# Patient Record
Sex: Female | Born: 1980 | Hispanic: Yes | Marital: Single | Smoking: Never smoker
Health system: Southern US, Community
[De-identification: ages and names within clinical notes are randomized; demographics above are authoritative.]

---

## 2012-09-02 ENCOUNTER — Encounter: Payer: Self-pay | Admitting: Family Medicine

## 2012-09-02 ENCOUNTER — Ambulatory Visit (INDEPENDENT_AMBULATORY_CARE_PROVIDER_SITE_OTHER): Payer: Medicaid Other | Admitting: Family Medicine

## 2012-09-02 VITALS — BP 91/55 | HR 59 | Temp 97.5°F | Wt 166.2 lb

## 2012-09-02 DIAGNOSIS — E669 Obesity, unspecified: Secondary | ICD-10-CM

## 2012-09-02 DIAGNOSIS — D696 Thrombocytopenia, unspecified: Secondary | ICD-10-CM

## 2012-09-02 DIAGNOSIS — J309 Allergic rhinitis, unspecified: Secondary | ICD-10-CM

## 2012-09-02 DIAGNOSIS — R635 Abnormal weight gain: Secondary | ICD-10-CM

## 2012-09-02 LAB — CBC
HCT: 38 % (ref 36.0–46.0)
Platelets: 139 10*3/uL — ABNORMAL LOW (ref 150–400)
RDW: 13 % (ref 11.5–15.5)
WBC: 5.1 10*3/uL (ref 4.0–10.5)

## 2012-09-02 MED ORDER — CETIRIZINE HCL 10 MG PO CAPS: 10.0000 mg | ORAL_CAPSULE | Freq: Every day | ORAL | Status: DC

## 2012-09-02 MED ORDER — FLUTICASONE PROPIONATE 50 MCG/ACT NA SUSP: 2.0000 | Freq: Every day | NASAL | Status: DC

## 2012-09-03 NOTE — Progress Notes (Signed)
Patient ID: Ashley Skinner, female   DOB: 19-Jun-1980, 32 y.o.   MRN: [id number] New Patient Visit:  HPI:  Pt presents today for new pt visit.  Pt denies any acute issues today apart from allergic rhinitis and obesity.  Recently relocated from [place] to [place] area.  Has chronic sneezing and rhinorrhea.  Previously on flonase and zyrtec and would like refill.  No issues with this medication in the past.    Would also like a nutrition referral to help with weight loss.  Trying to exercise on a more regular basis.   Also with hx/o intermittent thrombocytopenia in the past of unknown origin.  Occurred several years ago. No recent flares. No reports of petechiae or easy bruising.  Not on ASA.   There are no active problems to display for this patient.  Past Medical History: No past medical history on file.  Past Surgical History: No past surgical history on file.  Social History: History   Social History  . Marital Status: Single    Spouse Name: N/A    Number of Children: N/A  . Years of Education: N/A   Social History Main Topics  . Smoking status: Smoker, Current Status Unknown    Types: Cigarettes  . Smokeless tobacco: Not on file     Comment: smokes on occ /socially  . Alcohol Use: Not on file  . Drug Use: Not on file  . Sexually Active: Not on file   Other Topics Concern  . Not on file   Social History Narrative  . No narrative on file    Family History: No family history on file.  Allergies: Allergies not on file  Current Outpatient Prescriptions  Medication Sig Dispense Refill  . Cetirizine HCl 10 MG CAPS Take 1 capsule (10 mg total) by mouth daily.  30 capsule  3  . fluticasone (FLONASE) 50 MCG/ACT nasal spray Place 2 sprays into the nose daily.  16 g  6   No current facility-administered medications for this visit.   Review Of Systems: 12 point ROS negative except as noted above in HPI  Physical Exam: Filed Vitals:   09/02/12 1213  BP:  91/55  Pulse: 59  Temp: 97.5 F (36.4 C)   General: alert and cooperative HEENT: PERRLA and extra ocular movement intact Heart: S1, S2 normal, no murmur, rub or gallop, regular rate and rhythm Lungs: clear to auscultation Abdomen: abdomen is soft without significant tenderness, masses, organomegaly or guarding Extremities: extremities normal, atraumatic, no cyanosis or edema Skin:no rashes, no ecchymoses Neurology: normal without focal findings  Labs and Imaging:  Lab Results  Component Value Date   WBC 5.1 09/02/2012   HGB 12.8 09/02/2012   HCT 38.0 09/02/2012   MCV 86.2 09/02/2012   PLT 139* 09/02/2012     Assessment and Plan:  Allergic rhinitis - Plan: fluticasone (FLONASE) 50 MCG/ACT nasal spray, Cetirizine HCl 10 MG CAPS  Obesity - Plan: Amb ref to Medical Nutrition Therapy-MNT  Thrombocytopenia, unspecified - Plan: CBC  Otherwise healthy.  Follow up as needed.  Will trend CBCs over next 6-12 months. Consider H/O referral if plts drop < 110.

## 2012-09-05 ENCOUNTER — Telehealth (HOSPITAL_COMMUNITY): Payer: Self-pay | Admitting: Dietician

## 2012-09-05 NOTE — Telephone Encounter (Signed)
Called and left message on voicemail at [phone].

## 2012-09-05 NOTE — Telephone Encounter (Signed)
Received referral via fax from [facility] for dx: obesity.

## 2012-09-06 ENCOUNTER — Telehealth: Payer: Self-pay | Admitting: Family Medicine

## 2012-09-06 NOTE — Telephone Encounter (Signed)
Spoke with pt and she has appt with nuturtionist at [facility].

## 2012-09-06 NOTE — Telephone Encounter (Signed)
Pt left voicemail at 1655 on 09/05/12. Returned call at [phone] tofay. She reports [facility] also called her as well. She would prefer to go to [place], as this is closer to her home. Appointment scheduled for 09/27/12 at 1400. Pt reports she is unsure of her class schedule at this time, but reports she will call back if this time does not work for her.

## 2012-09-08 ENCOUNTER — Encounter (INDEPENDENT_AMBULATORY_CARE_PROVIDER_SITE_OTHER): Payer: Medicaid Other | Admitting: *Deleted

## 2012-09-08 DIAGNOSIS — D696 Thrombocytopenia, unspecified: Secondary | ICD-10-CM

## 2012-09-08 LAB — COMPREHENSIVE METABOLIC PANEL
ALT: 15 U/L (ref 0–35)
AST: 13 U/L (ref 0–37)
Albumin: 4.5 g/dL (ref 3.5–5.2)
Calcium: 9.2 mg/dL (ref 8.4–10.5)
Chloride: 106 mEq/L (ref 96–112)
Potassium: 4.3 mEq/L (ref 3.5–5.3)
Total Protein: 6.8 g/dL (ref 6.0–8.3)

## 2012-09-08 NOTE — Progress Notes (Signed)
Patient ID: Ashley Skinner, female   DOB: 01/18/1981, 32 y.o.   MRN: [id number] Pt here for nurse visit for lab draw.  Thrombocytopenia, unspecified - Plan: Comprehensive metabolic panel, HIV antibody, CBC Add on DIC panel

## 2012-09-27 ENCOUNTER — Encounter (HOSPITAL_COMMUNITY): Payer: Self-pay | Admitting: Dietician

## 2012-09-27 NOTE — Progress Notes (Signed)
Outpatient Initial Nutrition Assessment  Date:09/27/2012   Appt Start Time: 1403  Referring Physician: [facility] Reason for Visit: obesity  Nutrition Assessment:  Height: 5\' 4"  (162.6 cm)   Weight: 168 lb (76.204 kg)   IBW: 120# %IBW: 140% UBW: 145# %UBW: 116% Body mass index is 28.82 kg/(m^2). Meets criteria for overweight.  Goal Weight: 149# (10% of current weight) Weight hx: Pt reports highest weight was 185#, after giving birth to her 26 year old son. Lowest adult weight was 130-135# around age 61. She reports UBW: 145, which she maintained up to 2 years ago, after the birth of her daughter.   Estimated nutritional needs: 1500-1600 kcals daily, 61-76 grams protein daily, 1.5-1.6 L fluid daily   PMH: History reviewed. No pertinent past medical history.  Medications:  Current Outpatient Rx  Name  Route  Sig  Dispense  Refill  . Cetirizine HCl 10 MG CAPS   Oral   Take 1 capsule (10 mg total) by mouth daily.   30 capsule   3   . fluticasone (FLONASE) 50 MCG/ACT nasal spray   Nasal   Place 2 sprays into the nose daily.   16 g   6     Labs: CMP     Component Value Date/Time   NA 141 09/08/2012 1645   K 4.3 09/08/2012 1645   CL 106 09/08/2012 1645   CO2 27 09/08/2012 1645   GLUCOSE 82 09/08/2012 1645   BUN 13 09/08/2012 1645   CREATININE 0.61 09/08/2012 1645   CALCIUM 9.2 09/08/2012 1645   PROT 6.8 09/08/2012 1645   ALBUMIN 4.5 09/08/2012 1645   AST 13 09/08/2012 1645   ALT 15 09/08/2012 1645   ALKPHOS 77 09/08/2012 1645   BILITOT 0.8 09/08/2012 1645    Lipid Panel  No results found for this basename: chol, trig, hdl, cholhdl, vldl, ldlcalc     No results found for this basename: HGBA1C   Lab Results  Component Value Date   CREATININE 0.61 09/08/2012     Lifestyle/ social habits: Ms. Carreto lives in [place], [place] with her mother, and 3 children (ages 47, 62, and 2). She recently relocated to [place] from [place]; pt reports she lost her job and moved where she could get family  support. She is currently unemployed. 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 She reports her stress level as an 3/10, citing she has very little stress and very good family support. She reports that she does not exercise, apart from playing with her kids outdoors. She used to exercise daily at the gym 3 years a go, participating in a variety of exercises.   Nutrition hx/habits: Ms. Vellucci reports no changes in her diet recently. She is concerned about her weight and would like to get back to 145#. She reports she was most successful with weight loss about 2 years ago, where she went from 180# to 145# with diet, exercise, as well as phentermine and hydrochlorothiazide. She reports she has made attempts to lose weight with diet and exercise in the past, but reports that "oit was so much easier" with the help of medications. She usually eats 1-2 meals per day. She does not have a set meal schedule ("My schedule is set around my kids"). She steams most meats and uses very little fat and oil. She also reports she has used an Manufacturing engineer diary in the past ([employer]ose [employer]!), but discontinued when her phone broke. She also reported that it was  difficult for her to track her calories with that app, as a directory of her cultural foods were not available. She reports that she would like to lose weight quickly.   Diet recall: Breakfast (9-11 AM): 3/4 cup cornflakes and 2% milk; Lunc9 @-3 PM): refried rice with steamed beef and broccoli, unsweetened tea with sweet and low; Dinner: skip; Snack: apple or orange.  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   Nutrition Intervention: Nutrition rx: 1200 kcal NAS, no added sugar diet; 3 meals per day; whole grains, lean meats and proteins most often; 3-5 servings of fruits and vegetables daily; low calorie beverages only; 30 minutes physical activity  daily  Education/Counseling Provided: Educated pt on principles of weight management. Discussed principles of energy expenditure and how changes in diet and physical activity affect weight status. Discussed nutritional content of commonly eaten foods and suggested healthier alternatives. Educated pt on plate method and a general, healthful diet that includes low fat dairy, lean meats, whole fruits and vegetables, and whole grains most often. Discussed importance of a healthy diet along with regular physical activity (at least 30 minutes 5 times per week) to achieve weight loss goals. Encouraged slow, moderate weight loss (0.5-2# weight loss per week) and adopting healthy lifestyle changes vs. obtaining a certain body type or weight. Encouraged weighing self weekly at a consistent day and time of choice. Showed pt functionality of MyFitnessPal and encouraged using a food diary to better track caloric intake. Provided AND Nutrition Care Manual's "1200 Calore Diet" and "Weight Loss Tips" handouts. Also provided a list of nearby fitness centers per pt request. Used TeachBack to assess understanding.   Understanding, Motivation, Ability to Follow Recommendations: Expect fair compliance.   Monitoring and Evaluation: Goals: 1) 0.5-2# wt loss per week; 2) 3 meals per day; 3) 30 minutes physical activity daily  Recommendations: 1) For weight loss: At least 1200 kcals daily; 2) try to eat around the same time each day (3 meals daily); 3) Be creative with physical activity (play with children); 4) Break up exercise into smaller, more frequent sessions; 5) Try MyFitnessPal online food dairy (has more extensive food directory)  F/U: PRN. Pt did not desire follow-up; reports she understands what she needs to do.   Melody Haver, RD, LDN 09/27/2012  Appt EndTime: 1504

## 2012-10-07 ENCOUNTER — Ambulatory Visit (INDEPENDENT_AMBULATORY_CARE_PROVIDER_SITE_OTHER): Payer: Medicaid Other | Admitting: Family Medicine

## 2012-10-07 ENCOUNTER — Encounter: Payer: Self-pay | Admitting: Family Medicine

## 2012-10-07 VITALS — BP 99/59 | HR 75 | Temp 98.0°F | Ht 64.0 in | Wt 166.0 lb

## 2012-10-07 DIAGNOSIS — R635 Abnormal weight gain: Secondary | ICD-10-CM

## 2012-10-07 DIAGNOSIS — E669 Obesity, unspecified: Secondary | ICD-10-CM

## 2012-10-07 DIAGNOSIS — R634 Abnormal weight loss: Secondary | ICD-10-CM

## 2012-10-07 MED ORDER — PHENTERMINE HCL 37.5 MG PO CAPS: 37.5000 mg | ORAL_CAPSULE | ORAL | Status: DC

## 2012-10-07 NOTE — Progress Notes (Signed)
  Subjective:    Patient ID: Ashley Skinner, female    DOB: 11-12-1980, 32 y.o.   MRN: [id number]  HPI This 32 y.o. female presents for evaluation of weight loss and thrombocytopenia. She has been to see the nutritionist and has started a diet and has exercised And she has not been able to lose weight.  She has been having difficulty With having a strong appetite she states.   Review of Systems No chest pain, SOB, HA, dizziness, vision change, N/V, diarrhea, constipation, dysuria, urinary urgency or frequency, myalgias, arthralgias or rash.     Objective:   Physical Exam Vital signs noted  Well developed well nourished female.  HEENT - Head atraumatic Normocephalic                Eyes - PERRLA, Conjuctiva - clear Sclera- Clear EOMI                Ears - EAC's Wnl TM's Wnl Gross Hearing WNL                Nose - Nares patent                 Throat - oropharanx wnl Respiratory - Lungs CTA bilateral Cardiac - RRR S1 and S2 without murmur        Assessment & Plan:  Obesity, unspecified - Plan: phentermine 37.5 MG capsule po qd #30w/1 rf. Discussed this is not going to be rx'd again and that I do not think this is good for A long term solution for weight loss.  Discussed drinking 2 glasses of H2O before eating And eating fiber bars from [employer] with water to decrease appetite.  I also discussed that She needs to eat slow and consider a referral to weight loss center for weight loss management.  Loss of weight - Tx as above.

## 2012-10-07 NOTE — Patient Instructions (Signed)
Calorie Counting Diet A calorie counting diet requires you to eat the number of calories that are right for you in a day. Calories are the measurement of how much energy you get from the food you eat. Eating the right amount of calories is important for staying at a healthy weight. If you eat too many calories, your body will store them as fat and you may gain weight. If you eat too few calories, you may lose weight. Counting the number of calories you eat during a day will help you know if you are eating the right amount. A Registered Dietitian can determine how many calories you need in a day. The amount of calories needed varies from person to person. If your goal is to lose weight, you will need to eat fewer calories. Losing weight can benefit you if you are overweight or have health problems such as heart disease, high blood pressure, or diabetes. If your goal is to gain weight, you will need to eat more calories. Gaining weight may be necessary if you have a certain health problem that causes your body to need more energy. TIPS Whether you are increasing or decreasing the number of calories you eat during a day, it may be hard to get used to changes in what you eat and drink. The following are tips to help you keep track of the number of calories you eat.  Measure foods at home with measuring cups. This helps you know the amount of food and number of calories you are eating.  Restaurants often serve food in amounts that are larger than 1 serving. While eating out, estimate how many servings of a food you are given. For example, a serving of cooked rice is  cup or about the size of half of a fist. Knowing serving sizes will help you be aware of how much food you are eating at restaurants.  Ask for smaller portion sizes or child-size portions at restaurants.  Plan to eat half of a meal at a restaurant. Take the rest home or share the other half with a friend.  Read the Nutrition Facts panel on  food labels for calorie content and serving size. You can find out how many servings are in a package, the size of a serving, and the number of calories each serving has.  For example, a package might contain 3 cookies. The Nutrition Facts panel on that package says that 1 serving is 1 cookie. Below that, it will say there are 3 servings in the container. The calories section of the Nutrition Facts label says there are 90 calories. This means there are 90 calories in 1 cookie (1 serving). If you eat 1 cookie you have eaten 90 calories. If you eat all 3 cookies, you have eaten 270 calories (3 servings x 90 calories = 270 calories). The list below tells you how big or small some common portion sizes are.  1 oz.........4 stacked dice.  3 oz.........Deck of cards.  1 tsp........Tip of little finger.  1 tbs........Thumb.  2 tbs........Golf ball.   cup.......Half of a fist.  1 cup........A fist. KEEP A FOOD LOG Write down every food item you eat, the amount you eat, and the number of calories in each food you eat during the day. At the end of the day, you can add up the total number of calories you have eaten. It may help to keep a list like the one below. Find out the calorie information by reading the   Nutrition Facts panel on food labels. Breakfast  Bran cereal (1 cup, 110 calories).  Fat-free milk ( cup, 45 calories). Snack  Apple (1 medium, 80 calories). Lunch  Spinach (1 cup, 20 calories).  Tomato ( medium, 20 calories).  Chicken breast strips (3 oz, 165 calories).  Shredded cheddar cheese ( cup, 110 calories).  Light Italian dressing (2 tbs, 60 calories).  Whole-wheat bread (1 slice, 80 calories).  Tub margarine (1 tsp, 35 calories).  Vegetable soup (1 cup, 160 calories). Dinner  Pork chop (3 oz, 190 calories).  Brown rice (1 cup, 215 calories).  Steamed broccoli ( cup, 20 calories).  Strawberries (1  cup, 65 calories).  Whipped cream (1 tbs, 50  calories). Daily Calorie Total: 1425 Document Released: 03/09/2005 Document Revised: 06/01/2011 Document Reviewed: 09/03/2006 ExitCare Patient Information 2014 [employer].  

## 2012-10-19 ENCOUNTER — Encounter: Payer: Self-pay | Admitting: Nurse Practitioner

## 2012-10-19 ENCOUNTER — Ambulatory Visit (INDEPENDENT_AMBULATORY_CARE_PROVIDER_SITE_OTHER): Payer: Medicaid Other | Admitting: Nurse Practitioner

## 2012-10-19 VITALS — BP 105/65 | HR 72 | Temp 97.2°F | Ht 64.0 in | Wt 164.0 lb

## 2012-10-19 DIAGNOSIS — R635 Abnormal weight gain: Secondary | ICD-10-CM

## 2012-10-19 DIAGNOSIS — E669 Obesity, unspecified: Secondary | ICD-10-CM

## 2012-10-19 MED ORDER — PHENTERMINE HCL 37.5 MG PO CAPS: 37.5000 mg | ORAL_CAPSULE | ORAL | Status: DC

## 2012-10-19 NOTE — Progress Notes (Signed)
  Subjective:    Patient ID: Ashley Skinner, female    DOB: 03/23/1981, 32 y.o.   MRN: [id number]  HPI Pt here to be followed for phentermine for weight loss. Pt has been taking RX for 3 weeks and has lost 3 lbs. Pt wants to continue taking it. Pt denies s/s of side effects from medication.     Review of Systems  All other systems reviewed and are negative.       Objective:   Physical Exam  Vitals reviewed. Constitutional: She is oriented to person, place, and time. She appears well-developed and well-nourished.  Eyes: Pupils are equal, round, and reactive to light.  Neck: Normal range of motion. Neck supple.  Cardiovascular: Normal rate, regular rhythm, normal heart sounds and intact distal pulses.   Pulmonary/Chest: Effort normal and breath sounds normal.  Abdominal: Soft. Bowel sounds are normal. She exhibits no distension. There is no tenderness.  Musculoskeletal: Normal range of motion.  Neurological: She is alert and oriented to person, place, and time.  Skin: Skin is warm and dry.  Psychiatric: She has a normal mood and affect. Her behavior is normal. Judgment and thought content normal.    BP 105/65  Pulse 72  Temp(Src) 97.2 F (36.2 C) (Oral)  Ht 5\' 4"  (1.626 m)  Wt 164 lb (74.39 kg)  BMI 28.14 kg/m2       Assessment & Plan:  1. Obesity, unspecified Low fat diet and exercise Follow up in 3 months for weight check - phentermine 37.5 MG capsule; Take 1 capsule (37.5 mg total) by mouth every morning.  Dispense: 30 capsule; Refill: 2  Mary-Margaret Daphine Deutscher, FNP

## 2012-10-19 NOTE — Patient Instructions (Addendum)
Calorie Counting Diet A calorie counting diet requires you to eat the number of calories that are right for you in a day. Calories are the measurement of how much energy you get from the food you eat. Eating the right amount of calories is important for staying at a healthy weight. If you eat too many calories, your body will store them as fat and you may gain weight. If you eat too few calories, you may lose weight. Counting the number of calories you eat during a day will help you know if you are eating the right amount. A Registered Dietitian can determine how many calories you need in a day. The amount of calories needed varies from person to person. If your goal is to lose weight, you will need to eat fewer calories. Losing weight can benefit you if you are overweight or have health problems such as heart disease, high blood pressure, or diabetes. If your goal is to gain weight, you will need to eat more calories. Gaining weight may be necessary if you have a certain health problem that causes your body to need more energy. TIPS Whether you are increasing or decreasing the number of calories you eat during a day, it may be hard to get used to changes in what you eat and drink. The following are tips to help you keep track of the number of calories you eat.  Measure foods at home with measuring cups. This helps you know the amount of food and number of calories you are eating.  Restaurants often serve food in amounts that are larger than 1 serving. While eating out, estimate how many servings of a food you are given. For example, a serving of cooked rice is  cup or about the size of half of a fist. Knowing serving sizes will help you be aware of how much food you are eating at restaurants.  Ask for smaller portion sizes or child-size portions at restaurants.  Plan to eat half of a meal at a restaurant. Take the rest home or share the other half with a friend.  Read the Nutrition Facts panel on  food labels for calorie content and serving size. You can find out how many servings are in a package, the size of a serving, and the number of calories each serving has.  For example, a package might contain 3 cookies. The Nutrition Facts panel on that package says that 1 serving is 1 cookie. Below that, it will say there are 3 servings in the container. The calories section of the Nutrition Facts label says there are 90 calories. This means there are 90 calories in 1 cookie (1 serving). If you eat 1 cookie you have eaten 90 calories. If you eat all 3 cookies, you have eaten 270 calories (3 servings x 90 calories = 270 calories). The list below tells you how big or small some common portion sizes are.  1 oz.........4 stacked dice.  3 oz.........Deck of cards.  1 tsp........Tip of little finger.  1 tbs........Thumb.  2 tbs........Golf ball.   cup.......Half of a fist.  1 cup........A fist. KEEP A FOOD LOG Write down every food item you eat, the amount you eat, and the number of calories in each food you eat during the day. At the end of the day, you can add up the total number of calories you have eaten. It may help to keep a list like the one below. Find out the calorie information by reading the   Nutrition Facts panel on food labels. Breakfast  Bran cereal (1 cup, 110 calories).  Fat-free milk ( cup, 45 calories). Snack  Apple (1 medium, 80 calories). Lunch  Spinach (1 cup, 20 calories).  Tomato ( medium, 20 calories).  Chicken breast strips (3 oz, 165 calories).  Shredded cheddar cheese ( cup, 110 calories).  Light Italian dressing (2 tbs, 60 calories).  Whole-wheat bread (1 slice, 80 calories).  Tub margarine (1 tsp, 35 calories).  Vegetable soup (1 cup, 160 calories). Dinner  Pork chop (3 oz, 190 calories).  Brown rice (1 cup, 215 calories).  Steamed broccoli ( cup, 20 calories).  Strawberries (1  cup, 65 calories).  Whipped cream (1 tbs, 50  calories). Daily Calorie Total: 1425 Document Released: 03/09/2005 Document Revised: 06/01/2011 Document Reviewed: 09/03/2006 ExitCare Patient Information 2014 [employer].  

## 2012-12-10 ENCOUNTER — Ambulatory Visit (INDEPENDENT_AMBULATORY_CARE_PROVIDER_SITE_OTHER): Payer: Medicaid Other | Admitting: Family Medicine

## 2012-12-10 VITALS — BP 99/64 | HR 67 | Temp 98.1°F | Ht 64.0 in | Wt 163.0 lb

## 2012-12-10 DIAGNOSIS — N390 Urinary tract infection, site not specified: Secondary | ICD-10-CM

## 2012-12-10 DIAGNOSIS — B373 Candidiasis of vulva and vagina: Secondary | ICD-10-CM

## 2012-12-10 MED ORDER — FLUCONAZOLE 150 MG PO TABS: ORAL_TABLET | ORAL | Status: DC

## 2012-12-10 MED ORDER — CIPROFLOXACIN HCL 500 MG PO TABS: 500.0000 mg | ORAL_TABLET | Freq: Two times a day (BID) | ORAL | Status: DC

## 2012-12-10 NOTE — Patient Instructions (Signed)
Urinary Tract Infection  Urinary tract infections (UTIs) can develop anywhere along your urinary tract. Your urinary tract is your body's drainage system for removing wastes and extra water. Your urinary tract includes two kidneys, two ureters, a bladder, and a urethra. Your kidneys are a pair of bean-shaped organs. Each kidney is about the size of your fist. They are located below your ribs, one on each side of your spine.  CAUSES  Infections are caused by microbes, which are microscopic organisms, including fungi, viruses, and bacteria. These organisms are so small that they can only be seen through a microscope. Bacteria are the microbes that most commonly cause UTIs.  SYMPTOMS   Symptoms of UTIs may vary by age and gender of the patient and by the location of the infection. Symptoms in young women typically include a frequent and intense urge to urinate and a painful, burning feeling in the bladder or urethra during urination. Older women and men are more likely to be tired, shaky, and weak and have muscle aches and abdominal pain. A fever may mean the infection is in your kidneys. Other symptoms of a kidney infection include pain in your back or sides below the ribs, nausea, and vomiting.  DIAGNOSIS  To diagnose a UTI, your caregiver will ask you about your symptoms. Your caregiver also will ask to provide a urine sample. The urine sample will be tested for bacteria and white blood cells. White blood cells are made by your body to help fight infection.  TREATMENT   Typically, UTIs can be treated with medication. Because most UTIs are caused by a bacterial infection, they usually can be treated with the use of antibiotics. The choice of antibiotic and length of treatment depend on your symptoms and the type of bacteria causing your infection.  HOME CARE INSTRUCTIONS   If you were prescribed antibiotics, take them exactly as your caregiver instructs you. Finish the medication even if you feel better after you  have only taken some of the medication.   Drink enough water and fluids to keep your urine clear or pale yellow.   Avoid caffeine, tea, and carbonated beverages. They tend to irritate your bladder.   Empty your bladder often. Avoid holding urine for long periods of time.   Empty your bladder before and after sexual intercourse.   After a bowel movement, women should cleanse from front to back. Use each tissue only once.  SEEK MEDICAL CARE IF:    You have back pain.   You develop a fever.   Your symptoms do not begin to resolve within 3 days.  SEEK IMMEDIATE MEDICAL CARE IF:    You have severe back pain or lower abdominal pain.   You develop chills.   You have nausea or vomiting.   You have continued burning or discomfort with urination.  MAKE SURE YOU:    Understand these instructions.   Will watch your condition.   Will get help right away if you are not doing well or get worse.  Document Released: 12/17/2004 Document Revised: 09/08/2011 Document Reviewed: 04/17/2011  ExitCare Patient Information 2014 [employer].

## 2012-12-10 NOTE — Progress Notes (Signed)
  Subjective:    Patient ID: Ashley Skinner, female    DOB: Aug 04, 1980, 32 y.o.   MRN: [id number]  HPI This 32 y.o. female presents for evaluation of dysuria and urinary frequency. She also states she has a vaginal yeast infection.  She c/o of mild back pain..   Review of Systems C/o dysuria, urinary frequency, and back pain.   No chest pain, SOB, HA, dizziness, vision change, N/V, diarrhea, constipation or rash.  Objective:   Physical Exam Vital signs noted  Well developed well nourished female in NAD.  HEENT - Head atraumatic Normocephalic Respiratory - Lungs CTA bilateral Cardiac - RRR S1 and S2 without murmur GI - Abdomen soft Nontender and bowel sounds active x 4. MS - Neg CVA tenderness and no TTP to LS spine.   UA dipstick with trace of blood and 1 plus leukocytes    Assessment & Plan:  UTI (urinary tract infection) - Plan: ciprofloxacin (CIPRO) 500 MG tablet po bid x 7days #14 Push po fluids rest and take motrin and tylenol otc as directed for back pain.  Send UA cx.  Vaginal candidiasis - Plan: fluconazole (DIFLUCAN) 150 MG tablet po now and repeat in one week #2/1rf

## 2013-01-26 ENCOUNTER — Other Ambulatory Visit: Payer: Self-pay

## 2013-02-09 ENCOUNTER — Ambulatory Visit (INDEPENDENT_AMBULATORY_CARE_PROVIDER_SITE_OTHER): Payer: Medicaid Other | Admitting: Family Medicine

## 2013-02-09 ENCOUNTER — Encounter: Payer: Self-pay | Admitting: Family Medicine

## 2013-02-09 VITALS — BP 105/56 | HR 82 | Temp 97.6°F | Ht 64.0 in | Wt 167.0 lb

## 2013-02-09 DIAGNOSIS — B373 Candidiasis of vulva and vagina: Secondary | ICD-10-CM

## 2013-02-09 DIAGNOSIS — R509 Fever, unspecified: Secondary | ICD-10-CM

## 2013-02-09 DIAGNOSIS — N390 Urinary tract infection, site not specified: Secondary | ICD-10-CM

## 2013-02-09 DIAGNOSIS — J029 Acute pharyngitis, unspecified: Secondary | ICD-10-CM

## 2013-02-09 DIAGNOSIS — B351 Tinea unguium: Secondary | ICD-10-CM

## 2013-02-09 LAB — POCT URINALYSIS DIPSTICK
Bilirubin, UA: NEGATIVE
Blood, UA: NEGATIVE
Glucose, UA: NEGATIVE
Ketones, UA: NEGATIVE
Leukocytes, UA: NEGATIVE
Nitrite, UA: NEGATIVE
Protein, UA: NEGATIVE
Spec Grav, UA: 1.02
Urobilinogen, UA: NEGATIVE
pH, UA: 5

## 2013-02-09 LAB — POCT UA - MICROSCOPIC ONLY
Casts, Ur, LPF, POC: NEGATIVE
Crystals, Ur, HPF, POC: NEGATIVE
WBC, Ur, HPF, POC: 1.5

## 2013-02-09 LAB — POCT RAPID STREP A (OFFICE): Rapid Strep A Screen: POSITIVE — AB

## 2013-02-09 MED ORDER — AMOXICILLIN 875 MG PO TABS: 875.0000 mg | ORAL_TABLET | Freq: Two times a day (BID) | ORAL | Status: DC

## 2013-02-09 MED ORDER — TERBINAFINE HCL 250 MG PO TABS: 250.0000 mg | ORAL_TABLET | Freq: Every day | ORAL | Status: DC

## 2013-02-09 NOTE — Progress Notes (Signed)
  Subjective:    Patient ID: Ashley Skinner, female    DOB: [birth year], 32 y.o.   MRN: [id number]  HPI This 32 y.o. female presents for evaluation of URI and Urinary sx's.  She has had 3 UTI 's this year and she is worried something is wrong with her bladder.  She  States she does post coital voiding and she drinks cranberry juice but notices That after sex she has some dysuria.  She has been having urinary sx's again. She states she was just treated a few weeks ago and the sx's resolved with tx With cipro and then she had them return 2 weeks after stopping abx's.  She has  Been having sore throat and uri sx's.  She has been haivngsome toenail fungus on Her left toes.   Review of Systems C/o pharyngitis and urinary sx's and toenail fungus. No chest pain, SOB, HA, dizziness, vision change, N/V, diarrhea, constipation, myalgias, arthralgias or rash.     Objective:   Physical Exam  Vital signs noted  Well developed well nourished female.  HEENT - Head atraumatic Normocephalic                Eyes - PERRLA, Conjuctiva - clear Sclera- Clear EOMI                Ears - EAC's Wnl TM's Wnl Gross Hearing WNL                 Throat - oropharanx injected  Respiratory - Lungs CTA bilateral Cardiac - RRR S1 and S2 without murmur GI - Abdomen soft Nontender and bowel sounds active x 4 Extremities - No edema. Neuro - Grossly intact. Skin - Left toe fungus     Assessment & Plan:  Fever - Plan: POCT UA - Microscopic Only, POCT rapid strep A, POCT urinalysis dipstick, Ambulatory referral to Urology, amoxicillin (AMOXIL) 875 MG tablet  Sore throat - Plan: POCT rapid strep A, Ambulatory referral to Urology, amoxicillin (AMOXIL) 875 MG tablet  UTI (urinary tract infection) - Plan: Ambulatory referral to Urology  Onychomycosis - Plan: terbinafine (LAMISIL) 250 MG tablet one po qd #30w/3  Vaginal candidiasis - Discussed she will be taking lamisil and this should help to tx Any vaginal  candidiasis she may encounter with the abx's.  Deatra Canter FNP

## 2013-02-09 NOTE — Addendum Note (Signed)
Addended by: Lisbeth Ply C on: 02/09/2013 03:25 PM   Modules accepted: Orders

## 2013-02-09 NOTE — Patient Instructions (Signed)

## 2013-02-11 LAB — URINE CULTURE

## 2013-05-12 ENCOUNTER — Ambulatory Visit (INDEPENDENT_AMBULATORY_CARE_PROVIDER_SITE_OTHER): Payer: Medicaid Other | Admitting: General Practice

## 2013-05-12 ENCOUNTER — Encounter: Payer: Self-pay | Admitting: General Practice

## 2013-05-12 VITALS — BP 93/62 | HR 59 | Temp 97.4°F | Ht 64.0 in | Wt 158.0 lb

## 2013-05-12 DIAGNOSIS — J0100 Acute maxillary sinusitis, unspecified: Secondary | ICD-10-CM

## 2013-05-12 DIAGNOSIS — B373 Candidiasis of vulva and vagina: Secondary | ICD-10-CM

## 2013-05-12 DIAGNOSIS — J040 Acute laryngitis: Secondary | ICD-10-CM

## 2013-05-12 DIAGNOSIS — B3731 Acute candidiasis of vulva and vagina: Secondary | ICD-10-CM

## 2013-05-12 MED ORDER — AMOXICILLIN 500 MG PO CAPS: 500.0000 mg | ORAL_CAPSULE | Freq: Two times a day (BID) | ORAL | Status: DC

## 2013-05-12 MED ORDER — FLUCONAZOLE 150 MG PO TABS: 150.0000 mg | ORAL_TABLET | Freq: Once | ORAL | Status: DC

## 2013-05-12 NOTE — Progress Notes (Signed)
   Subjective:    Patient ID: Ashley Skinner, female    DOB: [birth year], 33 y.o.   MRN: [id number]  Sinusitis This is a new problem. The current episode started in the past 7 days. The problem has been gradually worsening since onset. There has been no fever. Associated symptoms include congestion and sinus pressure. Pertinent negatives include no chills or shortness of breath. Past treatments include spray decongestants. The treatment provided mild relief.      Review of Systems  Constitutional: Negative for fever and chills.  HENT: Positive for congestion and sinus pressure.   Respiratory: Negative for chest tightness and shortness of breath.   Cardiovascular: Negative for chest pain and palpitations.       Objective:   Physical Exam  Constitutional: She is oriented to person, place, and time. She appears well-developed and well-nourished.  HENT:  Head: Normocephalic and atraumatic.  Right Ear: External ear normal.  Left Ear: External ear normal.  Nose: Right sinus exhibits maxillary sinus tenderness. Left sinus exhibits maxillary sinus tenderness.  Cardiovascular: Normal rate, regular rhythm and normal heart sounds.   Pulmonary/Chest: Effort normal and breath sounds normal. No respiratory distress. She exhibits no tenderness.  Neurological: She is alert and oriented to person, place, and time.  Skin: Skin is warm and dry.  Psychiatric: She has a normal mood and affect.          Assessment & Plan:  1. Sinusitis, acute maxillary, 2. Laryngitis  - amoxicillin (AMOXIL) 500 MG capsule; Take 1 capsule (500 mg total) by mouth 2 (two) times daily.  Dispense: 20 capsule; Refill: 0  3. Vulvovaginal candidiasis  - fluconazole (DIFLUCAN) 150 MG tablet; Take 1 tablet (150 mg total) by mouth once. May repeat in 72  Dispense: 2 tablet; Refill: 0 -increase fluids -RTO if symptoms worsen or unresolved Patient verbalized understanding Coralie KeensMae E. Johnte Portnoy, FNP-C

## 2013-05-12 NOTE — Patient Instructions (Addendum)
Laryngitis At the top of your windpipe is your voice box. It is the source of your voice. Inside your voice box are 2 bands of muscles called vocal cords. When you breathe, your vocal cords are relaxed and open so that air can get into the lungs. When you decide to say something, these cords come together and vibrate. The sound from these vibrations goes into your throat and comes out through your mouth as sound. Laryngitis is an inflammation of the vocal cords that causes hoarseness, cough, loss of voice, sore throat, and dry throat. Laryngitis can be temporary (acute) or long-term (chronic). Most cases of acute laryngitis improve with time.Chronic laryngitis lasts for more than 3 weeks. CAUSES Laryngitis can often be related to excessive smoking, talking, or yelling, as well as inhalation of toxic fumes and allergies. Acute laryngitis is usually caused by a viral infection, vocal strain, measles or mumps, or bacterial infections. Chronic laryngitis is usually caused by vocal cord strain, vocal cord injury, postnasal drip, growths on the vocal cords, or acid reflux. SYMPTOMS   Cough.  Sore throat.  Dry throat. RISK FACTORS  Respiratory infections.  Exposure to irritating substances, such as cigarette smoke, excessive amounts of alcohol, stomach acids, and workplace chemicals.  Voice trauma, such as vocal cord injury from shouting or speaking too loud. DIAGNOSIS  Your cargiver will perform a physical exam. During the physical exam, your caregiver will examine your throat. The most common sign of laryngitis is hoarseness. Laryngoscopy may be necessary to confirm the diagnosis of this condition. This procedure allows your caregiver to look into the larynx. HOME CARE INSTRUCTIONS  Drink enough fluids to keep your urine clear or pale yellow.  Rest until you no longer have symptoms or as directed by your caregiver.  Breathe in moist air.  Take all medicine as directed by your  caregiver.  Do not smoke.  Talk as little as possible (this includes whispering).  Write on paper instead of talking until your voice is back to normal.  Follow up with your caregiver if your condition has not improved after 10 days. SEEK MEDICAL CARE IF:   You have trouble breathing.  You cough up blood.  You have persistent fever.  You have increasing pain.  You have difficulty swallowing. MAKE SURE YOU:  Understand these instructions.  Will watch your condition.  Will get help right away if you are not doing well or get worse. Document Released: 03/09/2005 Document Revised: 06/01/2011 Document Reviewed: 05/15/2010 [facility] Patient Information 2014 [place], [place].  Sinusitis Sinusitis is redness, soreness, and swelling (inflammation) of the paranasal sinuses. Paranasal sinuses are air pockets within the bones of your face (beneath the eyes, the middle of the forehead, or above the eyes). In healthy paranasal sinuses, mucus is able to drain out, and air is able to circulate through them by way of your nose. However, when your paranasal sinuses are inflamed, mucus and air can become trapped. This can allow bacteria and other germs to grow and cause infection. Sinusitis can develop quickly and last only a short time (acute) or continue over a long period (chronic). Sinusitis that lasts for more than 12 weeks is considered chronic.  CAUSES  Causes of sinusitis include:  Allergies.  Structural abnormalities, such as displacement of the cartilage that separates your nostrils (deviated septum), which can decrease the air flow through your nose and sinuses and affect sinus drainage.  Functional abnormalities, such as when the small hairs (cilia) that line your sinuses and  help remove mucus do not work properly or are not present. SYMPTOMS  Symptoms of acute and chronic sinusitis are the same. The primary symptoms are pain and pressure around the affected sinuses. Other symptoms  include:  Upper toothache.  Earache.  Headache.  Bad breath.  Decreased sense of smell and taste.  A cough, which worsens when you are lying flat.  Fatigue.  Fever.  Thick drainage from your nose, which often is green and may contain pus (purulent).  Swelling and warmth over the affected sinuses. DIAGNOSIS  Your caregiver will perform a physical exam. During the exam, your caregiver may:  Look in your nose for signs of abnormal growths in your nostrils (nasal polyps).  Tap over the affected sinus to check for signs of infection.  View the inside of your sinuses (endoscopy) with a special imaging device with a light attached (endoscope), which is inserted into your sinuses. If your caregiver suspects that you have chronic sinusitis, one or more of the following tests may be recommended:  Allergy tests.  Nasal culture A sample of mucus is taken from your nose and sent to a lab and screened for bacteria.  Nasal cytology A sample of mucus is taken from your nose and examined by your caregiver to determine if your sinusitis is related to an allergy. TREATMENT  Most cases of acute sinusitis are related to a viral infection and will resolve on their own within 10 days. Sometimes medicines are prescribed to help relieve symptoms (pain medicine, decongestants, nasal steroid sprays, or saline sprays).  However, for sinusitis related to a bacterial infection, your caregiver will prescribe antibiotic medicines. These are medicines that will help kill the bacteria causing the infection.  Rarely, sinusitis is caused by a fungal infection. In theses cases, your caregiver will prescribe antifungal medicine. For some cases of chronic sinusitis, surgery is needed. Generally, these are cases in which sinusitis recurs more than 3 times per year, despite other treatments. HOME CARE INSTRUCTIONS   Drink plenty of water. Water helps thin the mucus so your sinuses can drain more easily.  Use a  humidifier.  Inhale steam 3 to 4 times a day (for example, sit in the bathroom with the shower running).  Apply a warm, moist washcloth to your face 3 to 4 times a day, or as directed by your caregiver.  Use saline nasal sprays to help moisten and clean your sinuses.  Take over-the-counter or prescription medicines for pain, discomfort, or fever only as directed by your caregiver. SEEK IMMEDIATE MEDICAL CARE IF:  You have increasing pain or severe headaches.  You have nausea, vomiting, or drowsiness.  You have swelling around your face.  You have vision problems.  You have a stiff neck.  You have difficulty breathing. MAKE SURE YOU:   Understand these instructions.  Will watch your condition.  Will get help right away if you are not doing well or get worse. Document Released: 03/09/2005 Document Revised: 06/01/2011 Document Reviewed: 03/24/2011 [facility] Patient Information 2014 [place]

## 2013-05-22 ENCOUNTER — Encounter: Payer: Self-pay | Admitting: Nurse Practitioner

## 2013-05-22 ENCOUNTER — Ambulatory Visit (INDEPENDENT_AMBULATORY_CARE_PROVIDER_SITE_OTHER): Payer: Medicaid Other | Admitting: Nurse Practitioner

## 2013-05-22 VITALS — BP 109/62 | HR 73 | Temp 97.8°F | Ht 64.0 in | Wt 160.0 lb

## 2013-05-22 DIAGNOSIS — R635 Abnormal weight gain: Secondary | ICD-10-CM

## 2013-05-22 DIAGNOSIS — E663 Overweight: Secondary | ICD-10-CM

## 2013-05-22 MED ORDER — PHENTERMINE HCL 37.5 MG PO TABS: 37.5000 mg | ORAL_TABLET | Freq: Every day | ORAL | Status: DC

## 2013-05-22 NOTE — Patient Instructions (Signed)
Fat and Cholesterol Control Diet  Fat and cholesterol levels in your blood and organs are influenced by your diet. High levels of fat and cholesterol may lead to diseases of the heart, small and large blood vessels, gallbladder, liver, and pancreas.  CONTROLLING FAT AND CHOLESTEROL WITH DIET  Although exercise and lifestyle factors are important, your diet is key. That is because certain foods are known to raise cholesterol and others to lower it. The goal is to balance foods for their effect on cholesterol and more importantly, to replace saturated and trans fat with other types of fat, such as monounsaturated fat, polyunsaturated fat, and omega-3 fatty acids.  On average, a person should consume no more than 15 to 17 g of saturated fat daily. Saturated and trans fats are considered "bad" fats, and they will raise LDL cholesterol. Saturated fats are primarily found in animal products such as meats, butter, and cream. However, that does not mean you need to give up all your favorite foods. Today, there are good tasting, low-fat, low-cholesterol substitutes for most of the things you like to eat. Choose low-fat or nonfat alternatives. Choose round or loin cuts of red meat. These types of cuts are lowest in fat and cholesterol. Chicken (without the skin), fish, veal, and ground turkey breast are great choices. Eliminate fatty meats, such as hot dogs and salami. Even shellfish have little or no saturated fat. Have a 3 oz (85 g) portion when you eat lean meat, poultry, or fish.  Trans fats are also called "partially hydrogenated oils." They are oils that have been scientifically manipulated so that they are solid at room temperature resulting in a longer shelf life and improved taste and texture of foods in which they are added. Trans fats are found in stick margarine, some tub margarines, cookies, crackers, and baked goods.   When baking and cooking, oils are a great substitute for butter. The monounsaturated oils are  especially beneficial since it is believed they lower LDL and raise HDL. The oils you should avoid entirely are saturated tropical oils, such as coconut and palm.   Remember to eat a lot from food groups that are naturally free of saturated and trans fat, including fish, fruit, vegetables, beans, grains (barley, rice, couscous, bulgur wheat), and pasta (without cream sauces).   IDENTIFYING FOODS THAT LOWER FAT AND CHOLESTEROL   Soluble fiber may lower your cholesterol. This type of fiber is found in fruits such as apples, vegetables such as broccoli, potatoes, and carrots, legumes such as beans, peas, and lentils, and grains such as barley. Foods fortified with plant sterols (phytosterol) may also lower cholesterol. You should eat at least 2 g per day of these foods for a cholesterol lowering effect.   Read package labels to identify low-saturated fats, trans fat free, and low-fat foods at the supermarket. Select cheeses that have only 2 to 3 g saturated fat per ounce. Use a heart-healthy tub margarine that is free of trans fats or partially hydrogenated oil. When buying baked goods (cookies, crackers), avoid partially hydrogenated oils. Breads and muffins should be made from whole grains (whole-wheat or whole oat flour, instead of "flour" or "enriched flour"). Buy non-creamy canned soups with reduced salt and no added fats.   FOOD PREPARATION TECHNIQUES   Never deep-fry. If you must fry, either stir-fry, which uses very little fat, or use non-stick cooking sprays. When possible, broil, bake, or roast meats, and steam vegetables. Instead of putting butter or margarine on vegetables, use lemon   and herbs, applesauce, and cinnamon (for squash and sweet potatoes). Use nonfat yogurt, salsa, and low-fat dressings for salads.   LOW-SATURATED FAT / LOW-FAT FOOD SUBSTITUTES  Meats / Saturated Fat (g)  · Avoid: Steak, marbled (3 oz/85 g) / 11 g  · Choose: Steak, lean (3 oz/85 g) / 4 g  · Avoid: Hamburger (3 oz/85 g) / 7  g  · Choose: Hamburger, lean (3 oz/85 g) / 5 g  · Avoid: Ham (3 oz/85 g) / 6 g  · Choose: Ham, lean cut (3 oz/85 g) / 2.4 g  · Avoid: Chicken, with skin, dark meat (3 oz/85 g) / 4 g  · Choose: Chicken, skin removed, dark meat (3 oz/85 g) / 2 g  · Avoid: Chicken, with skin, light meat (3 oz/85 g) / 2.5 g  · Choose: Chicken, skin removed, light meat (3 oz/85 g) / 1 g  Dairy / Saturated Fat (g)  · Avoid: Whole milk (1 cup) / 5 g  · Choose: Low-fat milk, 2% (1 cup) / 3 g  · Choose: Low-fat milk, 1% (1 cup) / 1.5 g  · Choose: Skim milk (1 cup) / 0.3 g  · Avoid: Hard cheese (1 oz/28 g) / 6 g  · Choose: Skim milk cheese (1 oz/28 g) / 2 to 3 g  · Avoid: Cottage cheese, 4% fat (1 cup) / 6.5 g  · Choose: Low-fat cottage cheese, 1% fat (1 cup) / 1.5 g  · Avoid: Ice cream (1 cup) / 9 g  · Choose: Sherbet (1 cup) / 2.5 g  · Choose: Nonfat frozen yogurt (1 cup) / 0.3 g  · Choose: Frozen fruit bar / trace  · Avoid: Whipped cream (1 tbs) / 3.5 g  · Choose: Nondairy whipped topping (1 tbs) / 1 g  Condiments / Saturated Fat (g)  · Avoid: Mayonnaise (1 tbs) / 2 g  · Choose: Low-fat mayonnaise (1 tbs) / 1 g  · Avoid: Butter (1 tbs) / 7 g  · Choose: Extra light margarine (1 tbs) / 1 g  · Avoid: Coconut oil (1 tbs) / 11.8 g  · Choose: Olive oil (1 tbs) / 1.8 g  · Choose: Corn oil (1 tbs) / 1.7 g  · Choose: Safflower oil (1 tbs) / 1.2 g  · Choose: Sunflower oil (1 tbs) / 1.4 g  · Choose: Soybean oil (1 tbs) / 2.4 g  · Choose: Canola oil (1 tbs) / 1 g  Document Released: 03/09/2005 Document Revised: 07/04/2012 Document Reviewed: 08/28/2010  [employer]® Patient Information ©2014 [employer].

## 2013-05-22 NOTE — Progress Notes (Signed)
   Subjective:    Patient ID: Ashley Skinner, female    DOB: 1980/08/16, 33 y.o.   MRN: [id number]  HPI  Pt here today to ask about restarting diet pills to lose weight.      Review of Systems  HENT: Negative.   Respiratory: Negative.   Cardiovascular: Negative.   Gastrointestinal: Negative.   Musculoskeletal: Negative.   All other systems reviewed and are negative.       Objective:   Physical Exam  Constitutional: She appears well-developed and well-nourished.  Cardiovascular: Normal rate, regular rhythm and normal heart sounds.   Pulmonary/Chest: Effort normal and breath sounds normal.  Skin: Skin is warm.  Psychiatric: She has a normal mood and affect. Her behavior is normal. Judgment and thought content normal.   BP 109/62  Pulse 73  Temp(Src) 97.8 F (36.6 C) (Oral)  Ht 5\' 4"  (1.626 m)  Wt 160 lb (72.576 kg)  BMI 27.45 kg/m2        Assessment & Plan:   1. Overweight (BMI 25.0-29.9)    Meds ordered this encounter  Medications  . phentermine (ADIPEX-P) 37.5 MG tablet    Sig: Take 1 tablet (37.5 mg total) by mouth daily before breakfast.    Dispense:  30 tablet    Refill:  1    Order Specific Question:  Supervising Provider    Answer:  Deborra MedinaMOORE, DONALD W [[id number]]   Low fat diet and exercise Follow up in 3 months for weight check  Mary-Margaret Daphine DeutscherMartin, FNP

## 2013-06-28 ENCOUNTER — Other Ambulatory Visit: Payer: Self-pay | Admitting: Nurse Practitioner

## 2013-06-29 NOTE — Telephone Encounter (Signed)
Patient last seen in office on 05-22-13. Please advise. If approved please print and route to Pool B so nurse can call patient to pick up

## 2013-06-29 NOTE — Telephone Encounter (Signed)
ntbs for weight check for refill

## 2013-07-17 ENCOUNTER — Ambulatory Visit (INDEPENDENT_AMBULATORY_CARE_PROVIDER_SITE_OTHER): Payer: Medicaid Other | Admitting: Family Medicine

## 2013-07-17 ENCOUNTER — Encounter: Payer: Self-pay | Admitting: Family Medicine

## 2013-07-17 ENCOUNTER — Ambulatory Visit (INDEPENDENT_AMBULATORY_CARE_PROVIDER_SITE_OTHER): Payer: Medicaid Other

## 2013-07-17 VITALS — BP 94/62 | HR 66 | Temp 98.1°F | Ht 64.0 in | Wt 159.0 lb

## 2013-07-17 DIAGNOSIS — M25569 Pain in unspecified knee: Secondary | ICD-10-CM

## 2013-07-17 NOTE — Progress Notes (Signed)
   Subjective:    Patient ID: Ashley Skinner, female    DOB: [birth year], 33 y.o.   MRN: [id number]  HPI  This 33 y.o. female presents for evaluation of bilateral knee pain.  She has been having difficulty with Her knees she states for 7 years and she would like a referral to a orthopedic doctor.  She takes aleve on occasion.  Review of Systems C/o bilateral knee pain   No chest pain, SOB, HA, dizziness, vision change, N/V, diarrhea, constipation, dysuria, urinary urgency or frequency or rash.  Objective:   Physical Exam Vital signs noted  Well developed well nourished female.  HEENT - Head atraumatic Normocephalic                Eyes - PERRLA, Conjuctiva - clear Sclera- Clear EOMI                Ears - EAC's Wnl TM's Wnl Gross Hearing WNL                Throat - oropharanx wnl Respiratory - Lungs CTA bilateral Cardiac - RRR S1 and S2 without murmur MS - TTP and crepitus bilateral knees.  Xray left knee - no fracture Xray right knee - no fracture     Assessment & Plan:  Knee pain - Plan: Ambulatory referral to Orthopedic Surgery, DG Knee 1-2 Views Left, DG Knee 1-2 Views Right Continue aleve  Deatra CanterWilliam J Maiyah Goyne FNP

## 2013-08-21 ENCOUNTER — Encounter: Payer: Self-pay | Admitting: Obstetrics & Gynecology

## 2013-08-21 ENCOUNTER — Ambulatory Visit (INDEPENDENT_AMBULATORY_CARE_PROVIDER_SITE_OTHER): Payer: Medicaid Other | Admitting: Obstetrics & Gynecology

## 2013-08-21 ENCOUNTER — Other Ambulatory Visit (HOSPITAL_COMMUNITY)
Admission: RE | Admit: 2013-08-21 | Discharge: 2013-08-21 | Disposition: A | Discharge status: Home / Self Care | Payer: Medicaid Other | Source: Ambulatory Visit | Attending: Obstetrics & Gynecology | Admitting: Obstetrics & Gynecology

## 2013-08-21 VITALS — BP 113/69 | HR 91 | Resp 16 | Ht 64.0 in | Wt 157.0 lb

## 2013-08-21 DIAGNOSIS — Z975 Presence of (intrauterine) contraceptive device: Secondary | ICD-10-CM

## 2013-08-21 DIAGNOSIS — Z01419 Encounter for gynecological examination (general) (routine) without abnormal findings: Secondary | ICD-10-CM | POA: Insufficient documentation

## 2013-08-21 DIAGNOSIS — Z1151 Encounter for screening for human papillomavirus (HPV): Secondary | ICD-10-CM | POA: Insufficient documentation

## 2013-08-21 DIAGNOSIS — Z30431 Encounter for routine checking of intrauterine contraceptive device: Secondary | ICD-10-CM

## 2013-08-21 NOTE — Progress Notes (Signed)
Patient ID: Ashley Skinner, female   DOB: [birth year], 33 y.o.   MRN: [id number]  Chief Complaint  Patient presents with  . Gynecologic Exam    Last PAP 08/2010    HPI Ashley Skinner is a 33 y.o. female.  No LMP recorded. Patient is not currently having periods (Reason: IUD). [id number] Mirena in place for 3 years, has rare spotting. Last pap 2-3 year ago normal. Fibrocystic changes on left breast evaluated in [place] with sonogram HPI  History reviewed. No pertinent past medical history.  History reviewed. No pertinent past surgical history.  Family History  Problem Relation Age of Onset  . Ovarian cysts Mother   . Ovarian cancer Maternal Grandmother   . Hypertension Paternal Grandmother   . Diabetes Paternal Grandmother     Social History History  Substance Use Topics  . Smoking status: Never Smoker   . Smokeless tobacco: Never Used     Comment: smokes on occ /socially  . Alcohol Use: Yes     Comment: 1-2 per year    No Known Allergies  Current Outpatient Prescriptions  Medication Sig Dispense Refill  . phentermine (ADIPEX-P) 37.5 MG tablet Take 1 tablet (37.5 mg total) by mouth daily before breakfast.  30 tablet  1   No current facility-administered medications for this visit.    Review of Systems Review of Systems  Constitutional: Negative.   Respiratory: Negative.   Gastrointestinal: Negative.   Genitourinary: Negative.     Blood pressure 113/69, pulse 91, resp. rate 16, height 5\' 4"  (1.626 m), weight 71.215 kg (157 lb).  Physical Exam Physical Exam  Constitutional: She is oriented to person, place, and time. She appears well-developed. No distress.  Pulmonary/Chest: Effort normal. No respiratory distress.  Breasts symmetric, fibrocystic changes, no discrete mass  Genitourinary: Vagina normal and uterus normal. No vaginal discharge found.  No mass, pap done  Musculoskeletal: Normal range of motion.  Neurological: She is alert and oriented to person, place,  and time.  Skin: Skin is warm and dry.  Psychiatric: She has a normal mood and affect. Her behavior is normal.    Data Reviewed Office notes  Assessment    Normal gyn exam with IUD in place, string was noted on exam.     Plan    RTC for pap and cotesting in 5 years, change IUD in 2 years        Adam Phenix 08/21/2013, 3:13 PM

## 2013-08-21 NOTE — Patient Instructions (Signed)

## 2013-08-22 ENCOUNTER — Ambulatory Visit: Payer: Medicaid Other | Attending: Sports Medicine | Admitting: Physical Therapy

## 2013-08-22 DIAGNOSIS — IMO0001 Reserved for inherently not codable concepts without codable children: Secondary | ICD-10-CM | POA: Insufficient documentation

## 2013-08-22 DIAGNOSIS — R5381 Other malaise: Secondary | ICD-10-CM | POA: Diagnosis not present

## 2013-08-22 DIAGNOSIS — M25569 Pain in unspecified knee: Secondary | ICD-10-CM | POA: Insufficient documentation

## 2013-08-25 ENCOUNTER — Ambulatory Visit (INDEPENDENT_AMBULATORY_CARE_PROVIDER_SITE_OTHER): Payer: Medicaid Other

## 2013-08-25 ENCOUNTER — Ambulatory Visit (INDEPENDENT_AMBULATORY_CARE_PROVIDER_SITE_OTHER): Payer: Medicaid Other | Admitting: Family Medicine

## 2013-08-25 ENCOUNTER — Encounter: Payer: Self-pay | Admitting: Family Medicine

## 2013-08-25 VITALS — BP 100/63 | HR 77 | Temp 98.5°F | Ht 64.0 in | Wt 152.8 lb

## 2013-08-25 DIAGNOSIS — Z139 Encounter for screening, unspecified: Secondary | ICD-10-CM

## 2013-08-25 DIAGNOSIS — Z01818 Encounter for other preprocedural examination: Secondary | ICD-10-CM

## 2013-08-25 MED ORDER — PHENTERMINE HCL 37.5 MG PO TABS
37.5000 mg | ORAL_TABLET | Freq: Every day | ORAL | Status: DC
Start: 2013-08-25 — End: 2014-03-13

## 2013-08-25 NOTE — Progress Notes (Signed)
   Subjective:    Patient ID: Ashley Skinner, female    DOB: 1980-07-01, 33 y.o.   MRN: [id number]  HPI This 33 y.o. female presents for evaluation of pre-op clearance for tummy tuck and breast augmentation surgery.  She has no acute problems and she is not on any medicine at this time. She has not had surgery in the past.   Review of Systems    No chest pain, SOB, HA, dizziness, vision change, N/V, diarrhea, constipation, dysuria, urinary urgency or frequency, myalgias, arthralgias or rash.  Objective:   Physical Exam  Vital signs noted  Well developed well nourished female.  HEENT - Head atraumatic Normocephalic                Eyes - PERRLA, Conjuctiva - clear Sclera- Clear EOMI                Ears - EAC's Wnl TM's Wnl Gross Hearing WNL                Nose - Nares patent                 Throat - oropharanx wnl Respiratory - Lungs CTA bilateral Cardiac - RRR S1 and S2 without murmur GI - Abdomen soft Nontender and bowel sounds active x 4 Extremities - No edema. Neuro - Grossly intact.  EKG - NSR without acute ST-T changes    Assessment & Plan:  Screening - Plan: POCT CBC, BMP8+EGFR, DG Chest 2 View, Protime-INR, CBC With differential/Platelet, EKG 12-Lead  Pre-op evaluation - Low risk for perioperative complications and is clear medically for surgery.  Lysbeth Penner FNP

## 2013-08-26 LAB — BMP8+EGFR
BUN/Creatinine Ratio: 21 — ABNORMAL HIGH (ref 8–20)
BUN: 14 mg/dL (ref 6–20)
CO2: 24 mmol/L (ref 18–29)
Calcium: 9.5 mg/dL (ref 8.7–10.2)
Chloride: 101 mmol/L (ref 97–108)
Creatinine, Ser: 0.68 mg/dL (ref 0.57–1.00)
GFR calc Af Amer: 136 mL/min/{1.73_m2} (ref >59)
GFR calc non Af Amer: 118 mL/min/{1.73_m2} (ref >59)
Glucose: 85 mg/dL (ref 65–99)
Potassium: 3.7 mmol/L (ref 3.5–5.2)
Sodium: 140 mmol/L (ref 134–144)

## 2013-08-26 LAB — CBC WITH DIFFERENTIAL
BASOS ABS: 0 10*3/uL (ref 0.0–0.2)
Basos: 0 %
EOS: 1 %
Eosinophils Absolute: 0 10*3/uL (ref 0.0–0.4)
HEMATOCRIT: 39.9 % (ref 34.0–46.6)
Hemoglobin: 13.5 g/dL (ref 11.1–15.9)
Immature Grans (Abs): 0 10*3/uL (ref 0.0–0.1)
Immature Granulocytes: 0 %
LYMPHS: 26 %
Lymphocytes Absolute: 1.5 10*3/uL (ref 0.7–3.1)
MCH: 29.3 pg (ref 26.6–33.0)
MCHC: 33.8 g/dL (ref 31.5–35.7)
MCV: 87 fL (ref 79–97)
Monocytes Absolute: 0.5 10*3/uL (ref 0.1–0.9)
Monocytes: 8 %
Neutrophils Absolute: 3.7 10*3/uL (ref 1.4–7.0)
Neutrophils Relative %: 65 %
PLATELETS: 122 10*3/uL — AB (ref 150–379)
RBC: 4.61 x10E6/uL (ref 3.77–5.28)
RDW: 13.2 % (ref 12.3–15.4)
WBC: 5.6 10*3/uL (ref 3.4–10.8)

## 2013-08-26 LAB — PROTIME-INR
INR: 1.1 (ref 0.8–1.2)
Prothrombin Time: 11.4 s (ref 9.1–12.0)

## 2013-08-28 ENCOUNTER — Other Ambulatory Visit: Payer: Self-pay | Admitting: Family Medicine

## 2013-08-28 DIAGNOSIS — R911 Solitary pulmonary nodule: Secondary | ICD-10-CM

## 2013-08-28 NOTE — Progress Notes (Signed)
Tried calling patient numbers not working

## 2013-08-31 ENCOUNTER — Ambulatory Visit (HOSPITAL_COMMUNITY)
Admission: RE | Admit: 2013-08-31 | Discharge: 2013-08-31 | Disposition: A | Discharge status: Home / Self Care | Payer: Medicaid Other | Source: Ambulatory Visit | Attending: Family Medicine | Admitting: Family Medicine

## 2013-08-31 ENCOUNTER — Encounter (HOSPITAL_COMMUNITY): Payer: Self-pay

## 2013-08-31 DIAGNOSIS — R911 Solitary pulmonary nodule: Secondary | ICD-10-CM

## 2013-08-31 DIAGNOSIS — R918 Other nonspecific abnormal finding of lung field: Secondary | ICD-10-CM | POA: Insufficient documentation

## 2013-08-31 MED ORDER — IOHEXOL 300 MG/ML  SOLN
80.0000 mL | Freq: Once | INTRAMUSCULAR | Status: AC | PRN
  Administered 2013-08-31: 80 mL via INTRAVENOUS

## 2013-09-01 ENCOUNTER — Telehealth: Payer: Self-pay | Admitting: Family Medicine

## 2013-09-01 ENCOUNTER — Other Ambulatory Visit: Payer: Self-pay | Admitting: Family Medicine

## 2013-09-01 DIAGNOSIS — E079 Disorder of thyroid, unspecified: Secondary | ICD-10-CM

## 2013-09-01 NOTE — Telephone Encounter (Signed)
CT is now back will you please review for patient

## 2013-09-01 NOTE — Telephone Encounter (Signed)
CT of chest was normal no pulmonary nodule

## 2013-09-04 ENCOUNTER — Telehealth: Payer: Self-pay | Admitting: Family Medicine

## 2013-09-04 NOTE — Telephone Encounter (Signed)
Message copied by Azalee CourseFULP, ASHLEY on Mon Sep 04, 2013  9:41 AM ------      Message from: Deatra CanterXFORD, WILLIAM J      Created: Fri Sep 01, 2013  7:18 PM       CT of chest shows thyroid hypodensity and US is recommended so US is ordered ------

## 2013-09-04 NOTE — Telephone Encounter (Signed)
Look at the lab results for result not phone

## 2013-09-14 ENCOUNTER — Other Ambulatory Visit (HOSPITAL_COMMUNITY): Payer: Medicaid Other

## 2013-09-18 ENCOUNTER — Ambulatory Visit (HOSPITAL_COMMUNITY)
Admission: RE | Admit: 2013-09-18 | Discharge: 2013-09-18 | Disposition: A | Discharge status: Home / Self Care | Payer: Medicaid Other | Source: Ambulatory Visit | Attending: Family Medicine | Admitting: Family Medicine

## 2013-09-18 DIAGNOSIS — E079 Disorder of thyroid, unspecified: Secondary | ICD-10-CM

## 2013-09-18 DIAGNOSIS — E041 Nontoxic single thyroid nodule: Secondary | ICD-10-CM | POA: Diagnosis not present

## 2014-01-22 ENCOUNTER — Encounter (HOSPITAL_COMMUNITY): Payer: Self-pay

## 2014-03-06 ENCOUNTER — Telehealth: Payer: Self-pay | Admitting: Family Medicine

## 2014-03-06 NOTE — Telephone Encounter (Signed)
Patient states she may go to urgent care today but if not will call us back 1st thing in the morning.

## 2014-03-12 ENCOUNTER — Telehealth: Payer: Self-pay | Admitting: Nurse Practitioner

## 2014-03-12 NOTE — Telephone Encounter (Signed)
Appointment given for tomorrow with Moore.  

## 2014-03-13 ENCOUNTER — Ambulatory Visit (INDEPENDENT_AMBULATORY_CARE_PROVIDER_SITE_OTHER): Payer: Medicaid Other

## 2014-03-13 ENCOUNTER — Encounter: Payer: Self-pay | Admitting: Family Medicine

## 2014-03-13 ENCOUNTER — Ambulatory Visit (INDEPENDENT_AMBULATORY_CARE_PROVIDER_SITE_OTHER): Payer: Medicaid Other | Admitting: Family Medicine

## 2014-03-13 VITALS — BP 99/64 | HR 73 | Temp 98.0°F | Ht 64.0 in | Wt 148.0 lb

## 2014-03-13 DIAGNOSIS — M94261 Chondromalacia, right knee: Secondary | ICD-10-CM

## 2014-03-13 DIAGNOSIS — M545 Low back pain: Secondary | ICD-10-CM

## 2014-03-13 DIAGNOSIS — M25561 Pain in right knee: Secondary | ICD-10-CM

## 2014-03-13 DIAGNOSIS — S46912A Strain of unspecified muscle, fascia and tendon at shoulder and upper arm level, left arm, initial encounter: Secondary | ICD-10-CM

## 2014-03-13 MED ORDER — NAPROXEN 500 MG PO TABS: 500.0000 mg | ORAL_TABLET | Freq: Two times a day (BID) | ORAL | Status: AC

## 2014-03-13 NOTE — Patient Instructions (Addendum)
Continue to take Naprosyn 500 mg, 1 twice daily after breakfast and supper Take as little Percocet as possible as this is a narcotic and it is habit forming Use warm wet compresses to left superior shoulder to low back and to right knee. Do this 20 minutes 3 or 4 times daily. Please follow through with physical therapy and let them evaluate and treat your left shoulder strain your low back strain and your right knee strain. Continue follow-up with orthopedist as planned
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
## 2014-03-20 ENCOUNTER — Ambulatory Visit: Payer: No Typology Code available for payment source | Attending: Family Medicine | Admitting: Physical Therapy

## 2014-03-20 DIAGNOSIS — M25562 Pain in left knee: Secondary | ICD-10-CM | POA: Insufficient documentation

## 2014-03-20 DIAGNOSIS — S46912D Strain of unspecified muscle, fascia and tendon at shoulder and upper arm level, left arm, subsequent encounter: Secondary | ICD-10-CM | POA: Diagnosis not present

## 2014-03-20 DIAGNOSIS — M545 Low back pain: Secondary | ICD-10-CM | POA: Insufficient documentation

## 2014-03-20 DIAGNOSIS — M25561 Pain in right knee: Secondary | ICD-10-CM | POA: Diagnosis not present

## 2014-03-20 DIAGNOSIS — Z4789 Encounter for other orthopedic aftercare: Secondary | ICD-10-CM | POA: Insufficient documentation

## 2014-03-21 ENCOUNTER — Ambulatory Visit: Payer: No Typology Code available for payment source | Admitting: Physical Therapy

## 2014-03-21 DIAGNOSIS — Z4789 Encounter for other orthopedic aftercare: Secondary | ICD-10-CM | POA: Diagnosis not present

## 2014-03-26 ENCOUNTER — Ambulatory Visit (INDEPENDENT_AMBULATORY_CARE_PROVIDER_SITE_OTHER): Payer: Medicaid Other | Admitting: Family Medicine

## 2014-03-26 ENCOUNTER — Encounter: Payer: Self-pay | Admitting: Family Medicine

## 2014-03-26 ENCOUNTER — Ambulatory Visit: Payer: No Typology Code available for payment source | Attending: Family Medicine | Admitting: Physical Therapy

## 2014-03-26 VITALS — BP 104/64 | HR 79 | Temp 96.7°F | Ht 64.0 in | Wt 146.0 lb

## 2014-03-26 DIAGNOSIS — M545 Low back pain: Secondary | ICD-10-CM | POA: Diagnosis not present

## 2014-03-26 DIAGNOSIS — S46912A Strain of unspecified muscle, fascia and tendon at shoulder and upper arm level, left arm, initial encounter: Secondary | ICD-10-CM

## 2014-03-26 DIAGNOSIS — M25562 Pain in left knee: Secondary | ICD-10-CM | POA: Insufficient documentation

## 2014-03-26 DIAGNOSIS — Z4789 Encounter for other orthopedic aftercare: Secondary | ICD-10-CM | POA: Insufficient documentation

## 2014-03-26 DIAGNOSIS — S46912D Strain of unspecified muscle, fascia and tendon at shoulder and upper arm level, left arm, subsequent encounter: Secondary | ICD-10-CM | POA: Diagnosis not present

## 2014-03-26 DIAGNOSIS — M25561 Pain in right knee: Secondary | ICD-10-CM | POA: Insufficient documentation

## 2014-03-26 NOTE — Patient Instructions (Signed)
Continue with physical therapy  Continue with Naprosyn   make sure that you take the Naprosyn after eating to reduce any irritation on your stomach  Use warm wet compresses to your low back
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
## 2014-04-03 ENCOUNTER — Encounter: Payer: Medicaid Other | Admitting: Physical Therapy

## 2014-04-05 ENCOUNTER — Ambulatory Visit: Payer: No Typology Code available for payment source | Admitting: Physical Therapy

## 2014-04-05 DIAGNOSIS — Z4789 Encounter for other orthopedic aftercare: Secondary | ICD-10-CM | POA: Diagnosis not present

## 2014-04-10 ENCOUNTER — Ambulatory Visit: Payer: No Typology Code available for payment source | Admitting: Physical Therapy

## 2014-04-10 DIAGNOSIS — Z4789 Encounter for other orthopedic aftercare: Secondary | ICD-10-CM | POA: Diagnosis not present

## 2014-04-12 ENCOUNTER — Encounter: Payer: Medicaid Other | Admitting: Physical Therapy

## 2014-04-23 ENCOUNTER — Ambulatory Visit (INDEPENDENT_AMBULATORY_CARE_PROVIDER_SITE_OTHER): Payer: Medicaid Other | Admitting: Family Medicine

## 2014-04-23 ENCOUNTER — Encounter: Payer: Self-pay | Admitting: Family Medicine

## 2014-04-23 VITALS — BP 111/65 | HR 71 | Temp 97.1°F | Ht 64.0 in | Wt 148.0 lb

## 2014-04-23 DIAGNOSIS — M545 Low back pain: Secondary | ICD-10-CM

## 2014-04-23 DIAGNOSIS — S46912A Strain of unspecified muscle, fascia and tendon at shoulder and upper arm level, left arm, initial encounter: Secondary | ICD-10-CM
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
## 2014-04-23 NOTE — Patient Instructions (Signed)
Continue with physical therapy When they feel like you to have reduced your maximum benefit and released you call and make 1 more appointment with us for a final evaluation Continue to take Naprosyn 500 twice daily. Take it more regularly, maybe twice a day after breakfast and supper for a week and then once a day after your biggest meal for 1 more week. The anti-inflammatory medicine will help your healing process continued.

## 2014-04-30 ENCOUNTER — Ambulatory Visit: Payer: No Typology Code available for payment source | Attending: Family Medicine | Admitting: Physical Therapy

## 2014-04-30 DIAGNOSIS — M545 Low back pain: Secondary | ICD-10-CM | POA: Diagnosis not present

## 2014-04-30 DIAGNOSIS — Z4789 Encounter for other orthopedic aftercare: Secondary | ICD-10-CM | POA: Insufficient documentation

## 2014-04-30 DIAGNOSIS — M25562 Pain in left knee: Secondary | ICD-10-CM | POA: Diagnosis not present

## 2014-04-30 DIAGNOSIS — M25561 Pain in right knee: Secondary | ICD-10-CM | POA: Diagnosis not present

## 2014-04-30 DIAGNOSIS — S46912D Strain of unspecified muscle, fascia and tendon at shoulder and upper arm level, left arm, subsequent encounter: Secondary | ICD-10-CM | POA: Diagnosis not present

## 2014-05-03 ENCOUNTER — Ambulatory Visit: Payer: No Typology Code available for payment source | Admitting: *Deleted

## 2014-05-03 DIAGNOSIS — Z4789 Encounter for other orthopedic aftercare: Secondary | ICD-10-CM | POA: Diagnosis not present

## 2014-05-07 ENCOUNTER — Encounter: Payer: Medicaid Other | Admitting: Physical Therapy

## 2014-06-14 ENCOUNTER — Encounter: Payer: Self-pay | Admitting: Family Medicine

## 2014-06-14 ENCOUNTER — Other Ambulatory Visit: Payer: Self-pay | Admitting: *Deleted

## 2014-06-14 ENCOUNTER — Ambulatory Visit (INDEPENDENT_AMBULATORY_CARE_PROVIDER_SITE_OTHER): Payer: Medicaid Other | Admitting: Family Medicine

## 2014-06-14 VITALS — BP 87/52 | HR 64 | Temp 97.9°F | Ht 64.0 in | Wt 150.0 lb

## 2014-06-14 DIAGNOSIS — S46912D Strain of unspecified muscle, fascia and tendon at shoulder and upper arm level, left arm, subsequent encounter: Secondary | ICD-10-CM | POA: Diagnosis not present

## 2014-06-14 DIAGNOSIS — M545 Low back pain: Secondary | ICD-10-CM

## 2014-06-14 DIAGNOSIS — M25561 Pain in right knee: Secondary | ICD-10-CM

## 2014-06-14 MED ORDER — LORATADINE 10 MG PO TABS: 10.0000 mg | ORAL_TABLET | Freq: Every day | ORAL | Status: AC

## 2014-06-14 MED ORDER — FLUTICASONE PROPIONATE 50 MCG/ACT NA SUSP: 2.0000 | Freq: Every day | NASAL | Status: AC
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
## 2014-06-14 NOTE — Progress Notes (Signed)
Pt called in and asked for these meds- Dr Christell ConstantMoore approved

## 2015-01-18 ENCOUNTER — Telehealth: Payer: Self-pay | Admitting: Nurse Practitioner

## 2015-01-18 MED ORDER — FLUCONAZOLE 150 MG PO TABS: ORAL_TABLET | ORAL | Status: AC

## 2015-01-18 NOTE — Telephone Encounter (Signed)
Diflucan 150 #2 take one stat and repeat in one week

## 2015-01-18 NOTE — Telephone Encounter (Signed)
Patient aware.

## 2015-02-14 IMAGING — CT CT CHEST W/ CM
2 of 3 series · 15 of 36 positions shown, 18 images · IV contrast (Omnipaque 300)
Comparison: None.

CLINICAL DATA: Abnormal chest x-ray

EXAM:
CT CHEST WITH CONTRAST
TECHNIQUE: Multidetector CT imaging of the chest was performed during
intravenous contrast administration.
CONTRAST:  80mL OMNIPAQUE IOHEXOL 300 MG/ML  SOLN

[Series 3: mpr coronal chest 3mm · coronal · 0.65mm/px · 3 of 87 slices shown]
[im 18/87  lung]
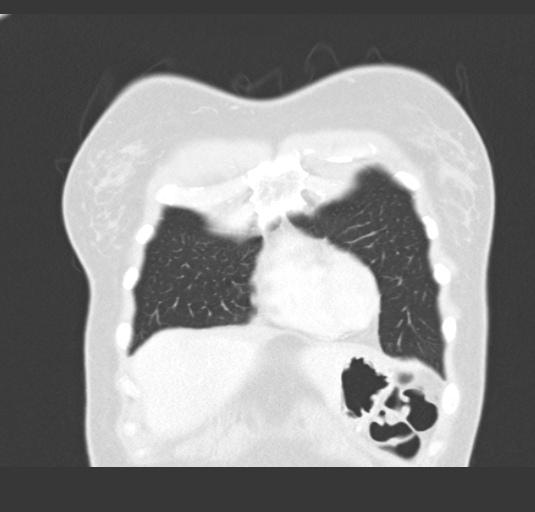
[im 35/87  lung]
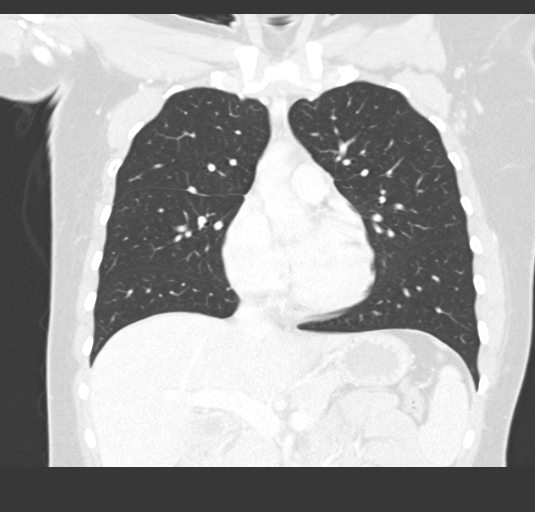
[im 52/87  lung]
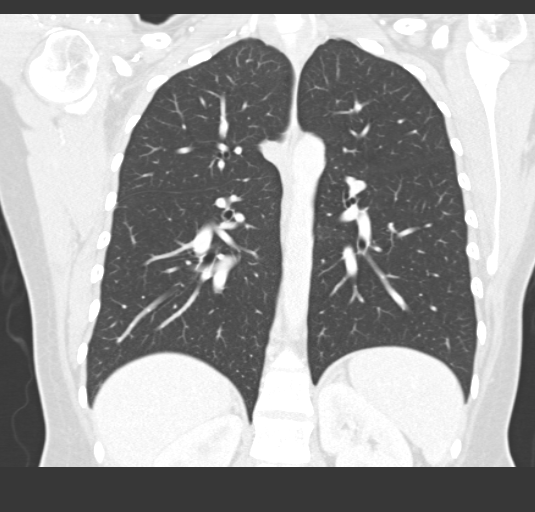

[Series 7: chestroutine 5.0 b40f · axial · 0.66mm/px · z∈[-279,-34]mm · 12 of 59 slices shown, 15 images]
[im 5/59  mediastinal]
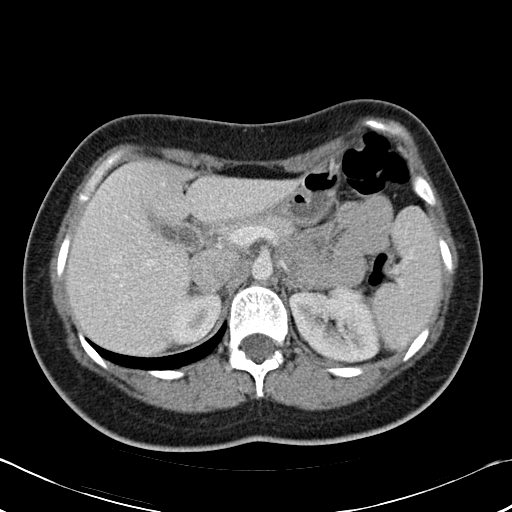
[im 5/59  lung]
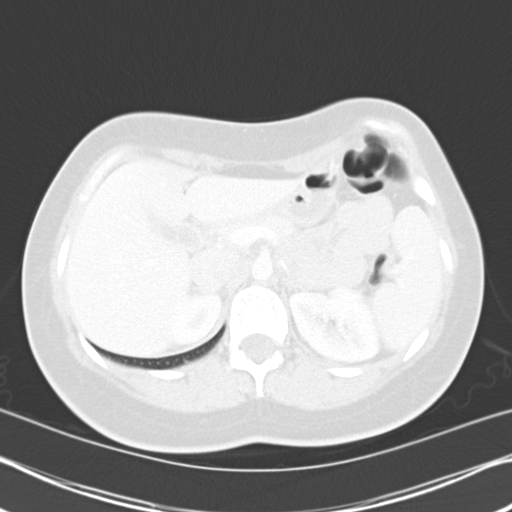
[im 9/59  lung]
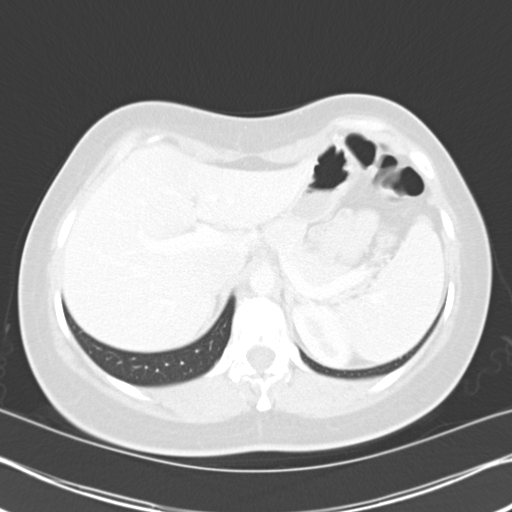
[im 13/59  lung]
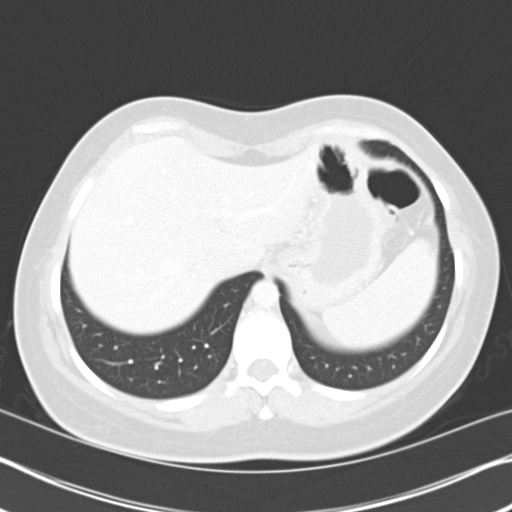
[im 18/59  lung]
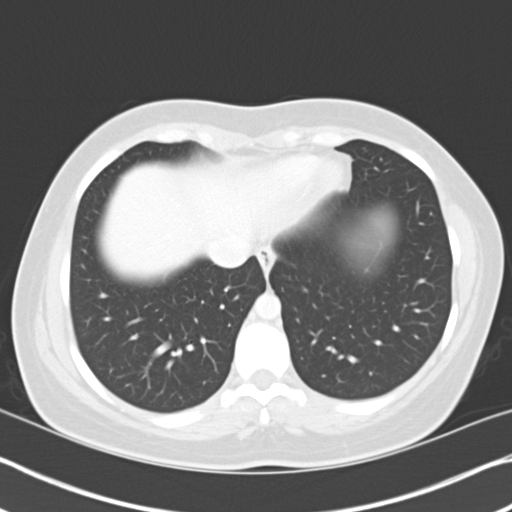
[im 22/59  mediastinal]
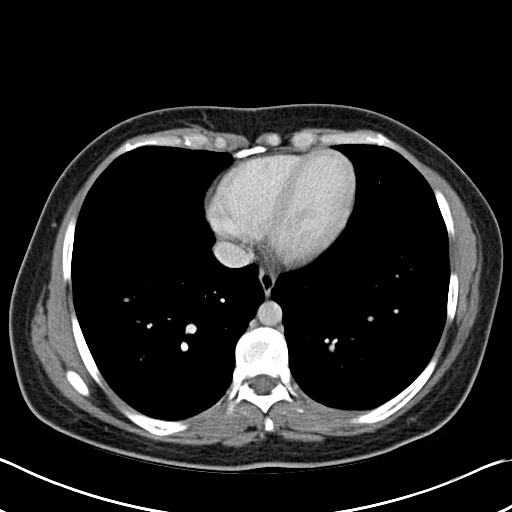
[im 22/59  lung]
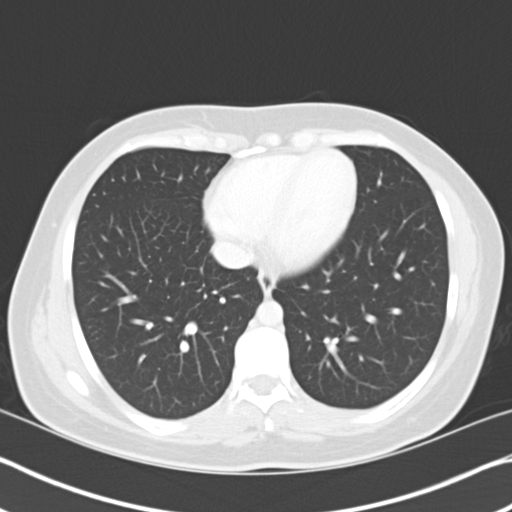
[im 26/59  lung]
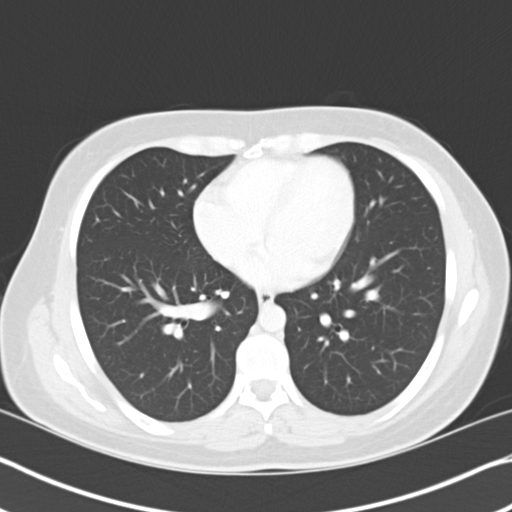
[im 33/59  lung]
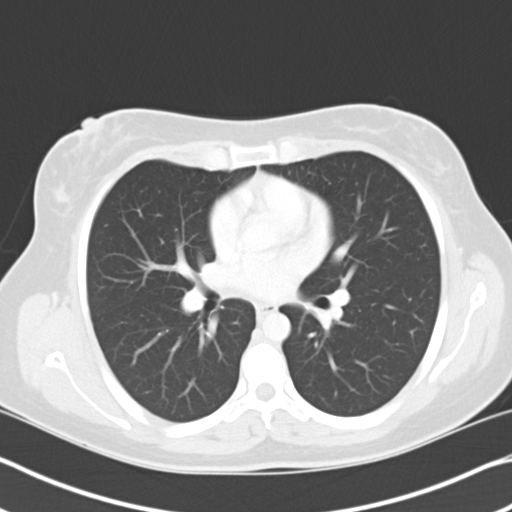
[im 37/59  lung]
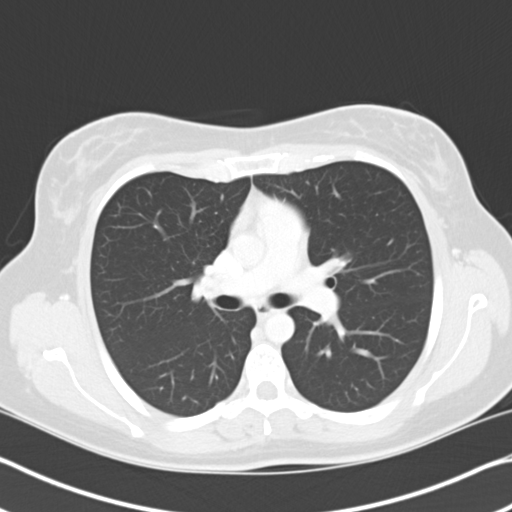
[im 41/59  mediastinal]
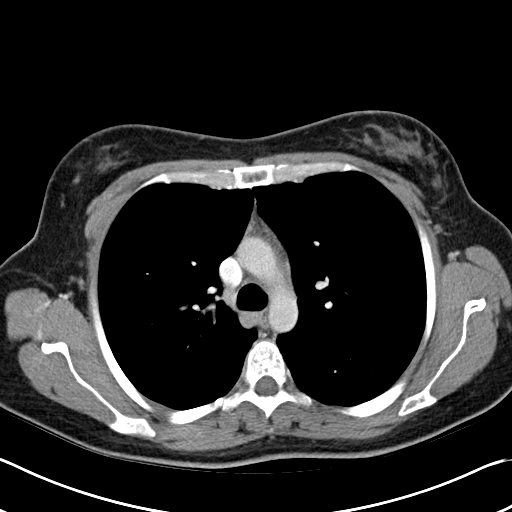
[im 41/59  lung]
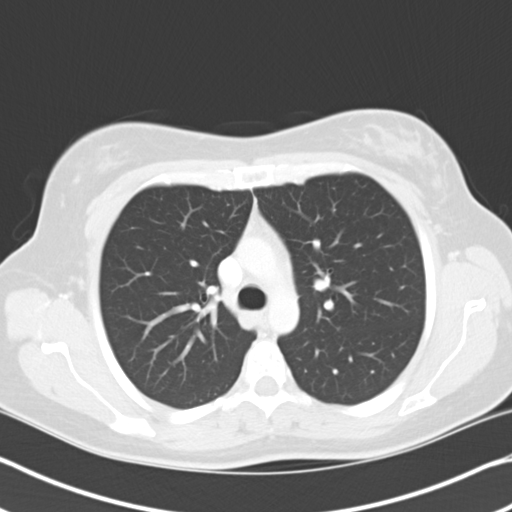
[im 46/59  lung]
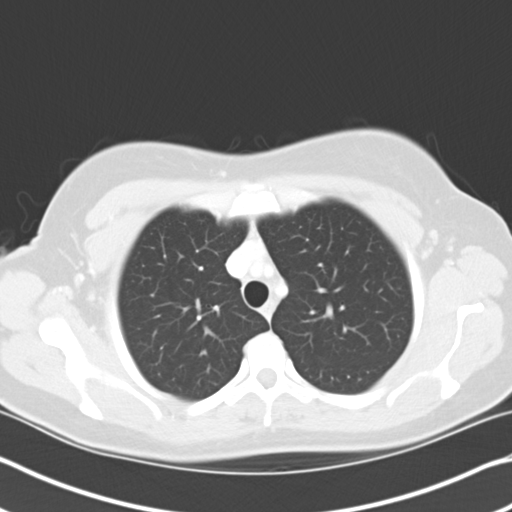
[im 50/59  lung]
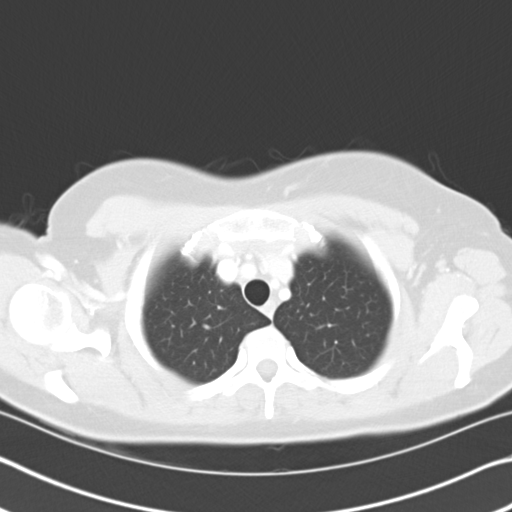
[im 54/59  lung]
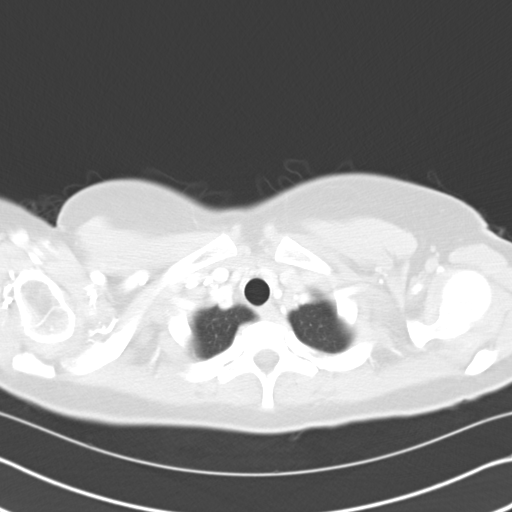

[15 of 36 positions shown; findings below may reference images not displayed]

FINDINGS: Tiny hypodensity in the left thyroid gland.

No abnormal mediastinal adenopathy.  Residual thymic tissue noted.

No pneumothorax.  No pleural effusion.

Clear lungs.

No acute bony deformity.

The gallbladder has a lobulated appearance. Small gallstones are not
excluded.
IMPRESSION: No evidence of pulmonary nodule.

Left thyroid hypodensity.  Ultrasound is recommended.

Possible gallstones.

## 2015-08-27 IMAGING — CR DG LUMBAR SPINE 2-3V
2 series · 2 of 2 positions shown · non-contrast
Comparison: None.

CLINICAL DATA: Low back pain

EXAM:
LUMBAR SPINE - 2-3 VIEW

[view not recorded (1 of 2)]
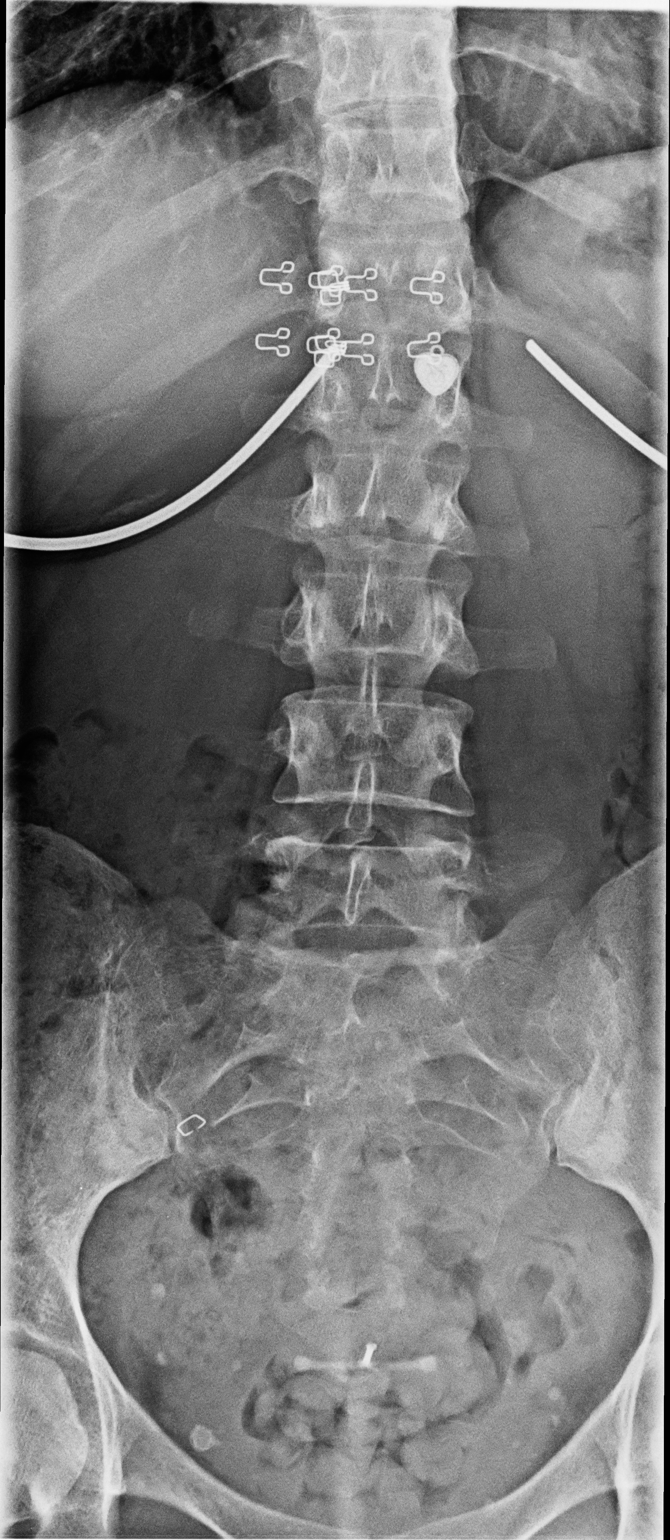

[view not recorded (2 of 2)]
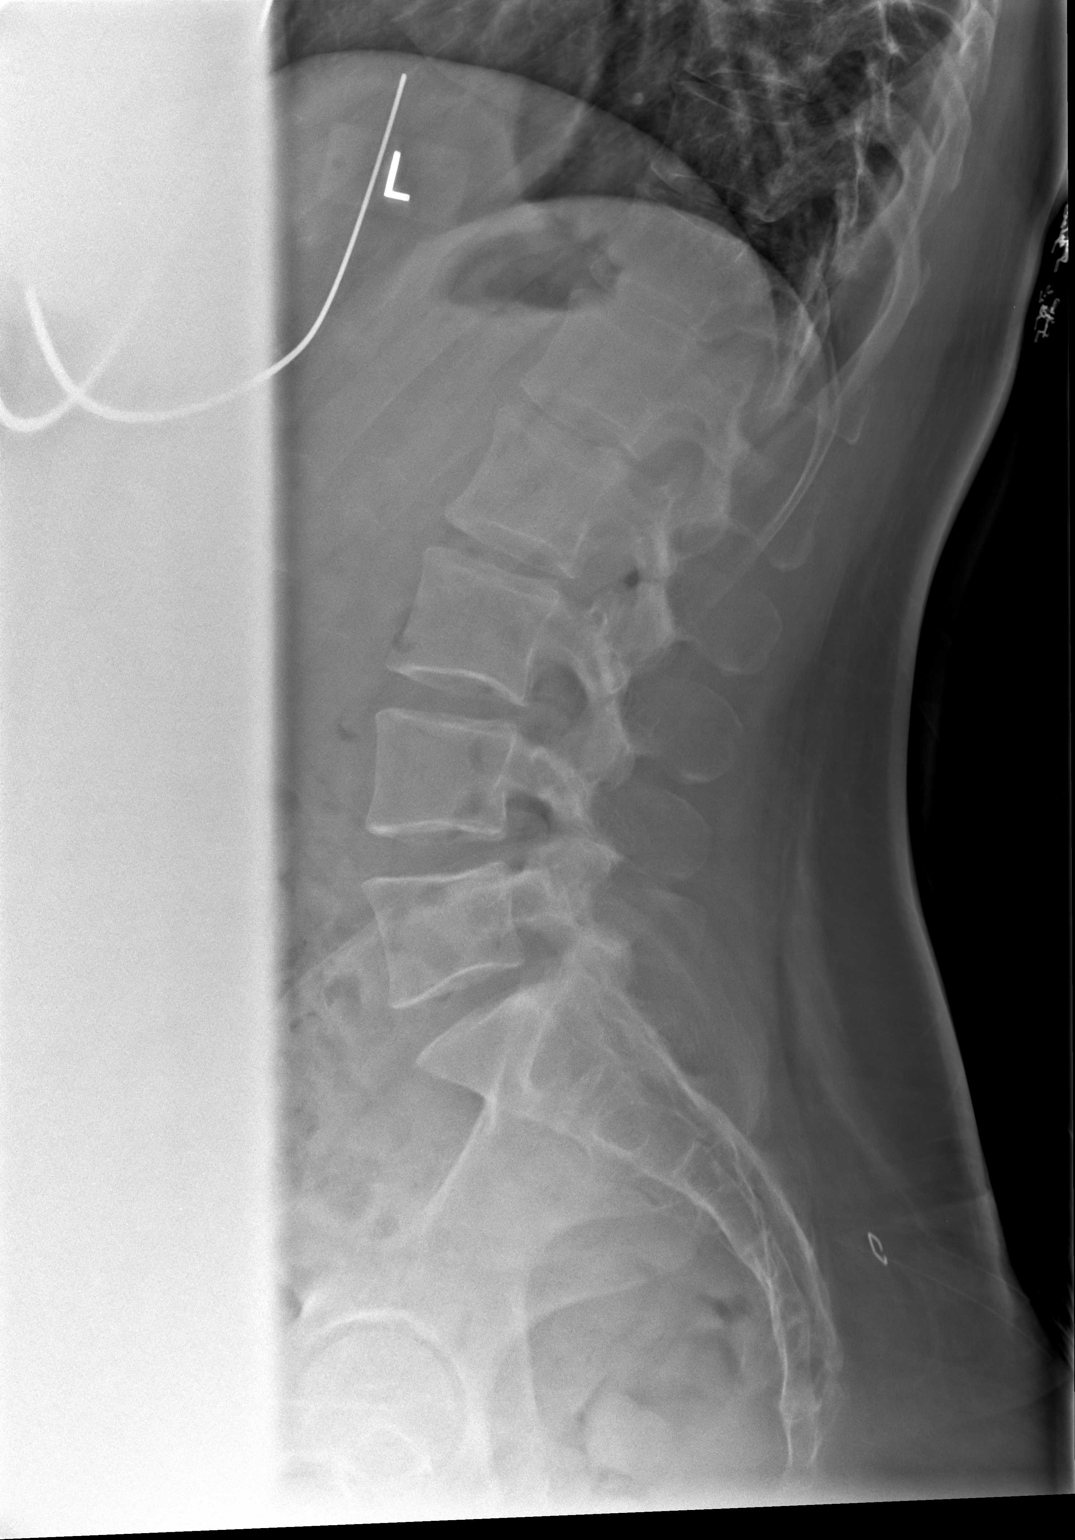

[2 of 2 positions shown; findings below may reference images not displayed]

FINDINGS: There is no evidence of lumbar spine fracture. Alignment is normal.
Intervertebral disc spaces are maintained.

IUD noted
IMPRESSION: Negative.
